# Patient Record
Sex: Male | Born: 1935 | Race: Black or African American | Hispanic: No | Marital: Single | Smoking: Former smoker
Health system: Southern US, Community
[De-identification: ages and names within clinical notes are randomized; demographics above are authoritative.]

## PROBLEM LIST (undated history)

## (undated) DIAGNOSIS — C61 Malignant neoplasm of prostate: Secondary | ICD-10-CM

---

## 2016-06-16 ENCOUNTER — Emergency Department (HOSPITAL_COMMUNITY): Payer: Self-pay

## 2016-06-16 ENCOUNTER — Encounter (HOSPITAL_COMMUNITY): Payer: Self-pay

## 2016-06-16 ENCOUNTER — Observation Stay (HOSPITAL_COMMUNITY)
Admission: EM | Admit: 2016-06-16 | Discharge: 2016-06-17 | Disposition: A | Discharge status: Home / Self Care | Payer: Self-pay | Attending: Internal Medicine | Admitting: Internal Medicine

## 2016-06-16 DIAGNOSIS — Z9981 Dependence on supplemental oxygen: Secondary | ICD-10-CM | POA: Insufficient documentation

## 2016-06-16 DIAGNOSIS — Z79899 Other long term (current) drug therapy: Secondary | ICD-10-CM | POA: Insufficient documentation

## 2016-06-16 DIAGNOSIS — E876 Hypokalemia: Secondary | ICD-10-CM | POA: Insufficient documentation

## 2016-06-16 DIAGNOSIS — I50813 Acute on chronic right heart failure: Secondary | ICD-10-CM | POA: Insufficient documentation

## 2016-06-16 DIAGNOSIS — R9431 Abnormal electrocardiogram [ECG] [EKG]: Secondary | ICD-10-CM | POA: Insufficient documentation

## 2016-06-16 DIAGNOSIS — J9621 Acute and chronic respiratory failure with hypoxia: Principal | ICD-10-CM | POA: Insufficient documentation

## 2016-06-16 DIAGNOSIS — Z87891 Personal history of nicotine dependence: Secondary | ICD-10-CM | POA: Insufficient documentation

## 2016-06-16 DIAGNOSIS — D649 Anemia, unspecified: Secondary | ICD-10-CM | POA: Insufficient documentation

## 2016-06-16 DIAGNOSIS — J441 Chronic obstructive pulmonary disease with (acute) exacerbation: Secondary | ICD-10-CM | POA: Diagnosis present

## 2016-06-16 DIAGNOSIS — I509 Heart failure, unspecified: Secondary | ICD-10-CM

## 2016-06-16 DIAGNOSIS — C61 Malignant neoplasm of prostate: Secondary | ICD-10-CM | POA: Insufficient documentation

## 2016-06-16 DIAGNOSIS — J984 Other disorders of lung: Secondary | ICD-10-CM

## 2016-06-16 DIAGNOSIS — Z7982 Long term (current) use of aspirin: Secondary | ICD-10-CM | POA: Insufficient documentation

## 2016-06-16 DIAGNOSIS — R6510 Systemic inflammatory response syndrome (SIRS) of non-infectious origin without acute organ dysfunction: Secondary | ICD-10-CM | POA: Insufficient documentation

## 2016-06-16 HISTORY — DX: Malignant neoplasm of prostate: C61

## 2016-06-16 LAB — BRAIN NATRIURETIC PEPTIDE
B NATRIURETIC PEPTIDE 5: 239.2 pg/mL — AB (ref 0.0–100.0)
B Natriuretic Peptide: 156.5 pg/mL — ABNORMAL HIGH (ref 0.0–100.0)

## 2016-06-16 LAB — COMPREHENSIVE METABOLIC PANEL
ALT: 7 U/L — AB (ref 17–63)
AST: 16 U/L (ref 15–41)
Albumin: 2.8 g/dL — ABNORMAL LOW (ref 3.5–5.0)
Alkaline Phosphatase: 47 U/L (ref 38–126)
Anion gap: 8 (ref 5–15)
BUN: 14 mg/dL (ref 6–20)
CHLORIDE: 102 mmol/L (ref 101–111)
CO2: 26 mmol/L (ref 22–32)
CREATININE: 0.88 mg/dL (ref 0.61–1.24)
Calcium: 8.5 mg/dL — ABNORMAL LOW (ref 8.9–10.3)
Glucose, Bld: 138 mg/dL — ABNORMAL HIGH (ref 65–99)
POTASSIUM: 3.1 mmol/L — AB (ref 3.5–5.1)
SODIUM: 136 mmol/L (ref 135–145)
Total Bilirubin: 0.5 mg/dL (ref 0.3–1.2)
Total Protein: 6.8 g/dL (ref 6.5–8.1)

## 2016-06-16 LAB — CBC WITH DIFFERENTIAL/PLATELET
BASOS ABS: 0 10*3/uL (ref 0.0–0.1)
BASOS PCT: 0 %
EOS ABS: 0.3 10*3/uL (ref 0.0–0.7)
EOS PCT: 4 %
HCT: 31.7 % — ABNORMAL LOW (ref 39.0–52.0)
Hemoglobin: 9.7 g/dL — ABNORMAL LOW (ref 13.0–17.0)
Lymphocytes Relative: 30 %
Lymphs Abs: 2.2 10*3/uL (ref 0.7–4.0)
MCH: 28.5 pg (ref 26.0–34.0)
MCHC: 30.6 g/dL (ref 30.0–36.0)
MCV: 93.2 fL (ref 78.0–100.0)
Monocytes Absolute: 0.7 10*3/uL (ref 0.1–1.0)
Monocytes Relative: 9 %
NEUTROS PCT: 57 %
Neutro Abs: 4.1 10*3/uL (ref 1.7–7.7)
PLATELETS: 233 10*3/uL (ref 150–400)
RBC: 3.4 MIL/uL — AB (ref 4.22–5.81)
RDW: 14.4 % (ref 11.5–15.5)
WBC: 7.3 10*3/uL (ref 4.0–10.5)

## 2016-06-16 LAB — TROPONIN I: TROPONIN I: 0.03 ng/mL — AB (ref <0.03)

## 2016-06-16 LAB — I-STAT VENOUS BLOOD GAS, ED
Acid-Base Excess: 6 mmol/L — ABNORMAL HIGH (ref 0.0–2.0)
Bicarbonate: 31.9 mmol/L — ABNORMAL HIGH (ref 20.0–28.0)
O2 Saturation: 81 %
PH VEN: 7.413 (ref 7.250–7.430)
TCO2: 33 mmol/L (ref 0–100)
pCO2, Ven: 50.1 mmHg (ref 44.0–60.0)
pO2, Ven: 46 mmHg — ABNORMAL HIGH (ref 32.0–45.0)

## 2016-06-16 LAB — I-STAT CG4 LACTIC ACID, ED: LACTIC ACID, VENOUS: 1.27 mmol/L (ref 0.5–1.9)

## 2016-06-16 LAB — PROTIME-INR
INR: 1.15
Prothrombin Time: 14.7 seconds (ref 11.4–15.2)

## 2016-06-16 LAB — APTT: aPTT: 39 seconds — ABNORMAL HIGH (ref 24–36)

## 2016-06-16 LAB — I-STAT TROPONIN, ED: TROPONIN I, POC: 0.01 ng/mL (ref 0.00–0.08)

## 2016-06-16 LAB — MAGNESIUM: MAGNESIUM: 1.6 mg/dL — AB (ref 1.7–2.4)

## 2016-06-16 LAB — LACTIC ACID, PLASMA: Lactic Acid, Venous: 3 mmol/L (ref 0.5–1.9)

## 2016-06-16 MED ORDER — METHYLPREDNISOLONE SODIUM SUCC 125 MG IJ SOLR
125.0000 mg | Freq: Once | INTRAMUSCULAR | Status: AC
  Administered 2016-06-16: 125 mg via INTRAVENOUS
  Filled 2016-06-16: qty 2

## 2016-06-16 MED ORDER — IPRATROPIUM-ALBUTEROL 0.5-2.5 (3) MG/3ML IN SOLN: 3.0000 mL | RESPIRATORY_TRACT | Status: DC | PRN

## 2016-06-16 MED ORDER — ACETAMINOPHEN 325 MG PO TABS: 650.0000 mg | ORAL_TABLET | Freq: Four times a day (QID) | ORAL | Status: DC | PRN

## 2016-06-16 MED ORDER — IPRATROPIUM-ALBUTEROL 0.5-2.5 (3) MG/3ML IN SOLN: 3.0000 mL | Freq: Once | RESPIRATORY_TRACT | Status: DC

## 2016-06-16 MED ORDER — OXYCODONE-ACETAMINOPHEN 5-325 MG PO TABS: 1.0000 | ORAL_TABLET | Freq: Three times a day (TID) | ORAL | Status: DC | PRN

## 2016-06-16 MED ORDER — SODIUM CHLORIDE 0.9% FLUSH
3.0000 mL | Freq: Two times a day (BID) | INTRAVENOUS | Status: DC
  Administered 2016-06-16: 3 mL via INTRAVENOUS

## 2016-06-16 MED ORDER — METHYLPREDNISOLONE SODIUM SUCC 125 MG IJ SOLR
60.0000 mg | Freq: Two times a day (BID) | INTRAMUSCULAR | Status: DC
  Administered 2016-06-17: 60 mg via INTRAVENOUS
  Filled 2016-06-16: qty 2

## 2016-06-16 MED ORDER — HYDROCOD POLST-CPM POLST ER 10-8 MG/5ML PO SUER
5.0000 mL | Freq: Two times a day (BID) | ORAL | Status: DC
  Administered 2016-06-16 – 2016-06-17 (×2): 5 mL via ORAL
  Filled 2016-06-16 (×2): qty 5

## 2016-06-16 MED ORDER — BENZONATATE 100 MG PO CAPS
200.0000 mg | ORAL_CAPSULE | Freq: Every day | ORAL | Status: DC
  Administered 2016-06-17: 200 mg via ORAL
  Filled 2016-06-16: qty 2

## 2016-06-16 MED ORDER — ASPIRIN EC 81 MG PO TBEC
81.0000 mg | DELAYED_RELEASE_TABLET | Freq: Every day | ORAL | Status: DC
  Administered 2016-06-17: 81 mg via ORAL
  Filled 2016-06-16: qty 1

## 2016-06-16 MED ORDER — VITAMIN D 1000 UNITS PO TABS
2000.0000 [IU] | ORAL_TABLET | Freq: Every day | ORAL | Status: DC
  Administered 2016-06-17: 2000 [IU] via ORAL
  Filled 2016-06-16: qty 2

## 2016-06-16 MED ORDER — SODIUM CHLORIDE 0.9% FLUSH: 3.0000 mL | INTRAVENOUS | Status: DC | PRN

## 2016-06-16 MED ORDER — OXYCODONE HCL 5 MG PO TABS: 5.0000 mg | ORAL_TABLET | Freq: Three times a day (TID) | ORAL | Status: DC | PRN

## 2016-06-16 MED ORDER — FUROSEMIDE 10 MG/ML IJ SOLN
20.0000 mg | Freq: Once | INTRAMUSCULAR | Status: AC
  Administered 2016-06-16: 20 mg via INTRAVENOUS
  Filled 2016-06-16: qty 2

## 2016-06-16 MED ORDER — IOPAMIDOL (ISOVUE-370) INJECTION 76%
INTRAVENOUS | Status: AC
  Administered 2016-06-16: 80 mL
  Filled 2016-06-16: qty 100

## 2016-06-16 MED ORDER — SODIUM CHLORIDE 0.9% FLUSH
3.0000 mL | Freq: Two times a day (BID) | INTRAVENOUS | Status: DC
  Administered 2016-06-17: 3 mL via INTRAVENOUS

## 2016-06-16 MED ORDER — SODIUM CHLORIDE 0.9 % IV SOLN: 250.0000 mL | INTRAVENOUS | Status: DC | PRN

## 2016-06-16 MED ORDER — OXYCODONE-ACETAMINOPHEN 10-325 MG PO TABS: 1.0000 | ORAL_TABLET | Freq: Three times a day (TID) | ORAL | Status: DC | PRN

## 2016-06-16 MED ORDER — ENOXAPARIN SODIUM 40 MG/0.4ML ~~LOC~~ SOLN
40.0000 mg | SUBCUTANEOUS | Status: DC
  Administered 2016-06-16: 40 mg via SUBCUTANEOUS
  Filled 2016-06-16: qty 0.4

## 2016-06-16 MED ORDER — POTASSIUM CHLORIDE CRYS ER 20 MEQ PO TBCR
40.0000 meq | EXTENDED_RELEASE_TABLET | Freq: Once | ORAL | Status: AC
  Administered 2016-06-16: 40 meq via ORAL
  Filled 2016-06-16: qty 2

## 2016-06-16 MED ORDER — ASPIRIN 81 MG PO CHEW
324.0000 mg | CHEWABLE_TABLET | Freq: Once | ORAL | Status: AC
  Administered 2016-06-16: 324 mg via ORAL
  Filled 2016-06-16: qty 4

## 2016-06-16 MED ORDER — LEVOFLOXACIN IN D5W 750 MG/150ML IV SOLN
750.0000 mg | INTRAVENOUS | Status: DC
  Administered 2016-06-16: 750 mg via INTRAVENOUS
  Filled 2016-06-16: qty 150

## 2016-06-16 MED ORDER — BICALUTAMIDE 50 MG PO TABS
50.0000 mg | ORAL_TABLET | Freq: Every day | ORAL | Status: DC
  Administered 2016-06-17: 50 mg via ORAL
  Filled 2016-06-16: qty 1

## 2016-06-16 MED ORDER — ACETAMINOPHEN 650 MG RE SUPP
650.0000 mg | Freq: Four times a day (QID) | RECTAL | Status: DC | PRN
Start: 2016-06-16 — End: 2016-06-17

## 2016-06-16 NOTE — ED Triage Notes (Signed)
Pt arrives [facility] with c/o SHOB. From out of town and became shob after walking up stairs. Pt states scarring on lungs from unknown reason. Arrives as neb treatment completes.

## 2016-06-16 NOTE — ED Notes (Signed)
Patient transported to CT 

## 2016-06-16 NOTE — H&P (Signed)
History and Physical    [facility] KH:[id number] DOB: 07/26/1936 DOA: 06/16/2016  PCP: No primary care provider on file.   Patient coming from: Home   Chief Complaint: SOB   HPI: Roy Lucas is a 80 y.o. male with medical history significant for prostate cancer and chronic lung disease managed with bronchodilators and prednisone who presents to the emergency department with acute dyspnea and fatigue. Patient is from [place], [place], having just arrived here by train on 06/14/2016 visit family and had been in his usual state of health upon arrival. Patient reports going to bed in his usual state last night, but waking this morning with a mild fatigue and general malaise. He went about his usual activities today, but upon ascending a flight of stairs, developed severe dyspnea and EMS was activated. Patient denies any lower extremity edema or tenderness, orthopnea, or PND. He endorses some malaise, but denies fevers or chills. He has an increase in his chronic cough, but it is not particularly productive. Patient uses 6 L per minute of supplemental oxygen at his baseline and was using that when symptoms develop. There was no associated chest pain, palpitations, nausea, or diaphoresis. There's been no hemoptysis. Patient denies sick contacts. He was placed on nonrebreather en route to the hospital.  ED Course: Upon arrival to the ED, patient is found to be febrile to 38.2 C, saturating 60% on room air, tachypneic in the 30s, tachycardic in the 120s, and with stable blood pressure. EKG demonstrates a sinus tachycardia with rate 125 and ST-depression in the inferolateral leads. Troponin is reassuring at 0.01. Chemistry panel is notable for a potassium of 3.1 and serum albumin of 2.8. CBC is notable for a normocytic anemia with hemoglobin of 9.7. Lactic acid is reassuring at 1.27. CTA PE study was obtained, negative for PE, and notable for chronic underlying lung disease with possible mild  edema or atypical infection superimposed. Also noted on CT are findings consistent with right heart failure. Patient was treated with 324 mg of aspirin, 20 mg IV Lasix, 125 mg IV Solu-Medrol, nebs, and 40 mEq oral potassium. Patient enjoyed some subjective improvement in the ED. Tachycardia improved considerably, as did his work of breathing and oxygenation. Patient remained hemodynamically stable in the ED and free of any angina. He will be observed on the telemetry unit for ongoing evaluation and management of acute on chronic hypoxic respiratory failure suspected secondary to infectious exacerbation in his chronic underlying lung disease.  Review of Systems:  All other systems reviewed and apart from HPI, are negative.  Past Medical History:  Diagnosis Date  . Cancer [facility])     History reviewed. No pertinent surgical history.   reports that he quit smoking about 30 years ago. He has never used smokeless tobacco. He reports that he does not drink alcohol or use drugs.  No Known Allergies  Family History  Problem Relation Age of Onset  . Heart attack Neg Hx      Prior to Admission medications   Medication Sig Start Date End Date Taking? Authorizing Provider  albuterol (PROVENTIL HFA;VENTOLIN HFA) 108 (90 Base) MCG/ACT inhaler Inhale 1 puff into the lungs every 6 (six) hours as needed for wheezing or shortness of breath.   Yes Historical Provider, MD  aspirin EC 81 MG tablet Take 81 mg by mouth daily.   Yes Historical Provider, MD  benzonatate (TESSALON) 100 MG capsule Take 200 mg by mouth daily.   Yes Historical Provider, MD  bicalutamide (CASODEX) 50  MG tablet Take 50 mg by mouth daily. 06/07/16  Yes Historical Provider, MD  chlorpheniramine-HYDROcodone (TUSSIONEX PENNKINETIC ER) 10-8 MG/5ML SUER Take 5 mLs by mouth 2 (two) times daily.   Yes Historical Provider, MD  Cholecalciferol (VITAMIN D) 2000 units tablet Take 2,000 Units by mouth daily.   Yes Historical Provider, MD    oxyCODONE-acetaminophen (PERCOCET) 10-325 MG tablet Take 1 tablet by mouth every 8 (eight) hours.   Yes Historical Provider, MD  OXYGEN Inhale 6 L into the lungs continuous.   Yes Historical Provider, MD  predniSONE (DELTASONE) 5 MG tablet Take 5 mg by mouth daily. 06/09/16  Yes Historical Provider, MD  PRESCRIPTION MEDICATION Cancer treatment injection every 3 months at [facility] in [place], [place] - next injection due approx 07/21/16   Yes Historical Provider, MD    Physical Exam: Vitals:   06/16/16 1745 06/16/16 1800 06/16/16 1815 06/16/16 1830  BP:  135/88 130/82 130/91  Pulse: 102 102 100 101  Resp: 11 (!) 32 (!) 30 (!) 33  Temp:      SpO2: 100% 99% 99% 98%  Weight:      Height:          Constitutional: Increased WOB, calm, comfortable Eyes: PERTLA, lids and conjunctivae normal ENMT: Mucous membranes are moist. Posterior pharynx clear of any exudate or lesions.   Neck: normal, supple, no masses, no thyromegaly Respiratory: Mildly diminished bilaterally with occasional expiratory wheeze. Speaking full sentences, no pallor.  Cardiovascular: S1 & S2 heard, regular rate and rhythm. Trace b/l pretibial edema. No significant JVD. Abdomen: No distension, no tenderness, no masses palpated. Bowel sounds normal.  Musculoskeletal: no clubbing / cyanosis. No joint deformity upper and lower extremities. Normal muscle tone.  Skin: no significant rashes, lesions, ulcers. Warm, dry, well-perfused. Neurologic: CN 2-12 grossly intact. Sensation intact, DTR normal. Strength 5/5 in all 4 limbs.  Psychiatric: Normal judgment and insight. Alert and oriented x 3. Normal mood and affect.     Labs on Admission: I have personally reviewed following labs and imaging studies  CBC:  Recent Labs Lab 06/16/16 1550  WBC 7.3  NEUTROABS 4.1  HGB 9.7*  HCT 31.7*  MCV 93.2  PLT 0000000   Basic Metabolic Panel:  Recent Labs Lab 06/16/16 1550  NA 136  K 3.1*  CL 102  CO2 26  GLUCOSE 138*  BUN 14   CREATININE 0.88  CALCIUM 8.5*   GFR: Estimated Creatinine Clearance: 62.4 mL/min (by C-G formula based on SCr of 0.88 mg/dL). Liver Function Tests:  Recent Labs Lab 06/16/16 1550  AST 16  ALT 7*  ALKPHOS 47  BILITOT 0.5  PROT 6.8  ALBUMIN 2.8*   No results for input(s): LIPASE, AMYLASE in the last 168 hours. No results for input(s): AMMONIA in the last 168 hours. Coagulation Profile: No results for input(s): INR, PROTIME in the last 168 hours. Cardiac Enzymes: No results for input(s): CKTOTAL, CKMB, CKMBINDEX, TROPONINI in the last 168 hours. BNP (last 3 results) No results for input(s): PROBNP in the last 8760 hours. HbA1C: No results for input(s): HGBA1C in the last 72 hours. CBG: No results for input(s): GLUCAP in the last 168 hours. Lipid Profile: No results for input(s): CHOL, HDL, LDLCALC, TRIG, CHOLHDL, LDLDIRECT in the last 72 hours. Thyroid Function Tests: No results for input(s): TSH, T4TOTAL, FREET4, T3FREE, THYROIDAB in the last 72 hours. Anemia Panel: No results for input(s): VITAMINB12, FOLATE, FERRITIN, TIBC, IRON, RETICCTPCT in the last 72 hours. Urine analysis: No results found for:  COLORURINE, APPEARANCEUR, LABSPEC, PHURINE, GLUCOSEU, HGBUR, BILIRUBINUR, KETONESUR, PROTEINUR, UROBILINOGEN, NITRITE, LEUKOCYTESUR Sepsis Labs: @LABRCNTIP (procalcitonin:4,lacticidven:4) )No results found for this or any previous visit (from the past 240 hour(s)).   Radiological Exams on Admission: Ct Angio Chest Pe W And/or Wo Contrast  Result Date: 06/16/2016 CLINICAL DATA:  Progressive shortness breath for 1 year. Former smoker. Tachycardia without chest pain. EXAM: CT ANGIOGRAPHY CHEST WITH CONTRAST TECHNIQUE: Multidetector CT imaging of the chest was performed using the standard protocol during bolus administration of intravenous contrast. Multiplanar CT image reconstructions and MIPs were obtained to evaluate the vascular anatomy. CONTRAST:  80 ml Isovue 370 COMPARISON:   None. FINDINGS: Cardiovascular: The pulmonary arteries are well opacified to the level of the subsegmental branches. No evidence of acute pulmonary embolism. There is central enlargement of the pulmonary arteries suspicious for pulmonary arterial hypertension. There is suboptimal opacification of the aortic lumen. There is diffuse atherosclerosis of the aorta, great vessels and coronary arteries. No acute vascular findings are seen. The heart is enlarged. There is no pericardial effusion. Mediastinum/Nodes: There are no enlarged mediastinal, hilar or axillary lymph nodes.There are small mediastinal and hilar lymph nodes. The thyroid gland, trachea and esophagus demonstrate no significant findings. Lungs/Pleura: There is no pleural effusion. There is evidence of severe underlying chronic lung disease with patchy multifocal ground-glass attenuation, subpleural and peribronchovascular reticulation and architectural distortion. Probable early honeycomb formation at both lung bases. Upper abdomen: There is some reflux of contrast into the IVC and hepatic veins. The visualized upper abdomen otherwise appears unremarkable. Musculoskeletal/Chest wall: There is no chest wall mass or suspicious osseous finding. Mild degenerative changes throughout the spine status post lower cervical fusion. Review of the MIP images confirms the above findings. IMPRESSION: 1. No evidence of acute pulmonary embolism. Central enlargement of the pulmonary arteries suspicious for pulmonary arterial hypertension. 2. Evidence of underlying chronic lung disease, possibly desquamative interstitial pneumonia. Without prior examinations, mild superimposed edema or atypical infection difficult to exclude. Chest radiographic follow up recommended. 3. Cardiomegaly with reflux of contrast into the IVC and hepatic veins consistent with right heart failure. 4. No evidence of thoracic malignancy. Electronically Signed   By: Richardean Sale M.D.   On:  06/16/2016 17:34    EKG: Independently reviewed. Sinus tachycardia (rate 125), ST-depressions in inferolateral leads.   Assessment/Plan  1. Acute on chronic hypoxic respiratory failure  - Pt requires 6 Lpm of supplemental O2 at baseline for a chronic lung disease for which he is seeing a pulmonologist in [place]; per discussion with patient and his son, saw pulm in September and there is aworkup is in progress   - With wheezing and diminished breath sounds on admission, suspect an exacerbation in obstructive lung disease; likely an infectious precipitant given fever on presentation; no infiltrate on CTA chest    2. Acute exacerbation in chronic lung disease  - Managed at home with supplemental O2, bronchodialator, and oral prednisone  - Improved in ED with nebs, 125 mg IV Solu-Medrol  - Continue DuoNeb q4h prn, Solu-Medrol 60 mg IV q12h  - Check sputum culture and gram-stain  - Levaquin added given fever and suspected infectious precipitant  - Monitor with continuous pulse oximetry and titrate FiO2 to maintain sat low-mid 90's    3. ?Acute on chronic right-heart failure - Evidence for right-heart failure on CTA chest, likely secondary to the chronic lung disease  - There is no significant peripheral edema on admission  - Given Lasix 20 mg IV in ED  -  Follow daily wts and I/O's    4. Normocytic anemia   - Hgb 9.7 on admission with no priors available for comparison  - No s/s of active blood-loss; no pallor    5. Hypokalemia - Serum potassium 3.1 on admission - 40 mEq oral potassium given in ED - Magnesium level pending, replete prn  - Repeat chem panel in am    6. ST-depressions on EKG  - No angina on arrival and pt denies hx of heart disease  - Initial troponin reassuring at 0.01  - ASA 324 mg given in ED  - ST-depressions could be secondary to hypokalemia  - Plan to replace potassium, monitor on telemetry, repeat troponin, and repeat EKG in am    DVT prophylaxis: sq  Lovenox  Code Status: Full  Family Communication: Son updated at bedside  Disposition Plan: Observe on telemetry Consults called: None Admission status: Observation    Vianne Bulls, MD [facility] Pager [phone]  If 7PM-7AM, please contact night-coverage [email or web address] Password [facility]  06/16/2016, 7:47 PM

## 2016-06-16 NOTE — ED Provider Notes (Addendum)
[name] DEPT Provider Note   CSN: [id number] Arrival date & time: 06/16/16  1505     History   Chief Complaint Chief Complaint  Patient presents with  . Shortness of Breath    HPI Roy Lucas is a 80 y.o. male. Patient is visiting family from [place]. He had a 12 hour train ride day before yesterday. After arriving he was very exhausted. His son reports that he spent most the day yesterday resting and sleeping. This is atypical for the patient. The patient reports that he did feel extra fatigued but did not have any focal pain. He denies chest pain. He denies lower extremity swelling. Patient does have a history of COPD and is on 6 L of nasal cannula oxygen at baseline. Family members also describe "scarring at the bases of his lungs". Patient has very distant smoking history and no longer smokes. No alcohol use. The patient did forget several of his medications while traveling. He has gone 2 days without his albuterol inhaler and also at baseline he reportedly takes a dose of prednisone which he has not had in 2 days. Patient son noted that he seemed more lethargic today and did not do the things he usually does such as get up and get dressed and read. The patient reports that today he got unusually short of breath fairly quickly. The patient's son reports that really got him anxious was that his father was shaking in his hands. The patient was brought via EMS with [facility]. He had significantly improved on arrival. He was off of his baseline oxygen and dropped oxygen saturation to 60% on room air. This corrected quickly with nonrebreather mask. At the time of my interview the patient is alert and interactive. He is appropriate. He is denying acute complaints of shortness of breath that is different from baseline at this time. Patient reports at baseline he become short of breath or uncomfortable if he lies on his left side or on his back. He states he typically sleeps on his  right side.  Patient's past medical history significant for prostate cancer that is being treated medically. Patient family deny any history of cardiac disease or congestive heart failure.  HPI  Past Medical History:  Diagnosis Date  . Cancer [facility])     Patient Active Problem List   Diagnosis Date Noted  . Acute on chronic respiratory failure with hypoxia ([place]) 06/16/2016  . Acute on chronic right heart failure 06/16/2016  . Normocytic anemia 06/16/2016  . Hypokalemia 06/16/2016  . Chronic lung disease 06/16/2016  . Former smoker 06/16/2016  . SIRS (systemic inflammatory response syndrome) ([place]) 06/16/2016  . CHF (congestive heart failure) ([place]) 06/16/2016    History reviewed. No pertinent surgical history.     Home Medications    Prior to Admission medications   Medication Sig Start Date End Date Taking? Authorizing Provider  albuterol (PROVENTIL HFA;VENTOLIN HFA) 108 (90 Base) MCG/ACT inhaler Inhale 1 puff into the lungs every 6 (six) hours as needed for wheezing or shortness of breath.   Yes Historical Provider, MD  aspirin EC 81 MG tablet Take 81 mg by mouth daily.   Yes Historical Provider, MD  benzonatate (TESSALON) 100 MG capsule Take 200 mg by mouth daily.   Yes Historical Provider, MD  bicalutamide (CASODEX) 50 MG tablet Take 50 mg by mouth daily. 06/07/16  Yes Historical Provider, MD  chlorpheniramine-HYDROcodone (TUSSIONEX PENNKINETIC ER) 10-8 MG/5ML SUER Take 5 mLs by mouth 2 (two) times daily.  Yes Historical Provider, MD  Cholecalciferol (VITAMIN D) 2000 units tablet Take 2,000 Units by mouth daily.   Yes Historical Provider, MD  oxyCODONE-acetaminophen (PERCOCET) 10-325 MG tablet Take 1 tablet by mouth every 8 (eight) hours.   Yes Historical Provider, MD  OXYGEN Inhale 6 L into the lungs continuous.   Yes Historical Provider, MD  predniSONE (DELTASONE) 5 MG tablet Take 5 mg by mouth daily. 06/09/16  Yes Historical Provider, MD  PRESCRIPTION MEDICATION Cancer  treatment injection every 3 months at [facility] in [place], [place] - next injection due approx 07/21/16   Yes Historical Provider, MD    Family History History reviewed. No pertinent family history.  Social History Social History  Substance Use Topics  . Smoking status: Former Smoker    Quit date: 03/16/1986  . Smokeless tobacco: Never Used  . Alcohol use No     Allergies   Review of patient's allergies indicates no known allergies.   Review of Systems Review of Systems 10 Systems reviewed and are negative for acute change except as noted in the HPI.   Physical Exam Updated Vital Signs BP 130/91   Pulse 101   Temp 100.7 F (38.2 C)   Resp (!) 33   Ht 5\' 6"  (1.676 m)   Wt 154 lb (69.9 kg)   SpO2 98%   BMI 24.86 kg/m   Physical Exam  Constitutional: He is oriented to person, place, and time. He appears well-developed and well-nourished.  HENT:  Head: Normocephalic and atraumatic.  Mouth/Throat: Oropharynx is clear and moist.  Eyes: Conjunctivae and EOM are normal.  Neck: Neck supple.  Cardiovascular:  No murmur heard. Tachycardia. Regular. No gross rub murmur gallop.  Pulmonary/Chest:  Mild increased work of breathing at rest. Patient speaking in full sentences. Pulmonary examination is for bilateral Rales at lower one thirds of the lung fields. No active wheezing.  Abdominal: Soft. He exhibits no distension. There is no tenderness.  Musculoskeletal: He exhibits no edema, tenderness or deformity.  Neurological: He is alert and oriented to person, place, and time. No cranial nerve deficit. He exhibits normal muscle tone. Coordination normal.  Skin: Skin is warm and dry.  Psychiatric: He has a normal mood and affect.  Nursing note and vitals reviewed.    ED Treatments / Results  Labs (all labs ordered are listed, but only abnormal results are displayed) Labs Reviewed  COMPREHENSIVE METABOLIC PANEL - Abnormal; Notable for the following:       Result Value    Potassium 3.1 (*)    Glucose, Bld 138 (*)    Calcium 8.5 (*)    Albumin 2.8 (*)    ALT 7 (*)    All other components within normal limits  CBC WITH DIFFERENTIAL/PLATELET - Abnormal; Notable for the following:    RBC 3.40 (*)    Hemoglobin 9.7 (*)    HCT 31.7 (*)    All other components within normal limits  I-STAT VENOUS BLOOD GAS, ED - Abnormal; Notable for the following:    pO2, Ven 46.0 (*)    Bicarbonate 31.9 (*)    Acid-Base Excess 6.0 (*)    All other components within normal limits  BRAIN NATRIURETIC PEPTIDE  URINALYSIS, ROUTINE W REFLEX MICROSCOPIC (NOT AT [facility])  Randolm Idol, ED  I-STAT CG4 LACTIC ACID, ED  I-STAT CG4 LACTIC ACID, ED    EKG  EKG Interpretation  Date/Time:  Friday June 16 2016 15:14:54 EDT Ventricular Rate:  125 PR Interval:  132 QRS Duration:  84 QT Interval:  280 QTC Calculation: 404 R Axis:   1 Text Interpretation:  Sinus tachycardia Cannot rule out Inferior infarct , age undetermined ST & T wave abnormality, consider lateral ischemia Abnormal ECG agree. no old comparison Confirmed by Johnney Killian, MD, Jeannie Done [phone]) on 06/16/2016 6:11:56 PM       Radiology Ct Angio Chest Pe W And/or Wo Contrast  Result Date: 06/16/2016 CLINICAL DATA:  Progressive shortness breath for 1 year. Former smoker. Tachycardia without chest pain. EXAM: CT ANGIOGRAPHY CHEST WITH CONTRAST TECHNIQUE: Multidetector CT imaging of the chest was performed using the standard protocol during bolus administration of intravenous contrast. Multiplanar CT image reconstructions and MIPs were obtained to evaluate the vascular anatomy. CONTRAST:  80 ml Isovue 370 COMPARISON:  None. FINDINGS: Cardiovascular: The pulmonary arteries are well opacified to the level of the subsegmental branches. No evidence of acute pulmonary embolism. There is central enlargement of the pulmonary arteries suspicious for pulmonary arterial hypertension. There is suboptimal opacification of the aortic lumen.  There is diffuse atherosclerosis of the aorta, great vessels and coronary arteries. No acute vascular findings are seen. The heart is enlarged. There is no pericardial effusion. Mediastinum/Nodes: There are no enlarged mediastinal, hilar or axillary lymph nodes.There are small mediastinal and hilar lymph nodes. The thyroid gland, trachea and esophagus demonstrate no significant findings. Lungs/Pleura: There is no pleural effusion. There is evidence of severe underlying chronic lung disease with patchy multifocal ground-glass attenuation, subpleural and peribronchovascular reticulation and architectural distortion. Probable early honeycomb formation at both lung bases. Upper abdomen: There is some reflux of contrast into the IVC and hepatic veins. The visualized upper abdomen otherwise appears unremarkable. Musculoskeletal/Chest wall: There is no chest wall mass or suspicious osseous finding. Mild degenerative changes throughout the spine status post lower cervical fusion. Review of the MIP images confirms the above findings. IMPRESSION: 1. No evidence of acute pulmonary embolism. Central enlargement of the pulmonary arteries suspicious for pulmonary arterial hypertension. 2. Evidence of underlying chronic lung disease, possibly desquamative interstitial pneumonia. Without prior examinations, mild superimposed edema or atypical infection difficult to exclude. Chest radiographic follow up recommended. 3. Cardiomegaly with reflux of contrast into the IVC and hepatic veins consistent with right heart failure. 4. No evidence of thoracic malignancy. Electronically Signed   By: Richardean Sale M.D.   On: 06/16/2016 17:34    Procedures Procedures (including critical care time) CRITICAL CARE Performed by: Charlesetta Shanks   Total critical care time: 30 minutes  Critical care time was exclusive of separately billable procedures and treating other patients.  Critical care was necessary to treat or prevent imminent  or life-threatening deterioration.  Critical care was time spent personally by me on the following activities: development of treatment plan with patient and/or surrogate as well as nursing, discussions with consultants, evaluation of patient's response to treatment, examination of patient, obtaining history from patient or surrogate, ordering and performing treatments and interventions, ordering and review of laboratory studies, ordering and review of radiographic studies, pulse oximetry and re-evaluation of patient's condition. Medications Ordered in ED Medications  potassium chloride SA (K-DUR,KLOR-CON) CR tablet 40 mEq (not administered)  ipratropium-albuterol (DUONEB) 0.5-2.5 (3) MG/3ML nebulizer solution 3 mL (not administered)  furosemide (LASIX) injection 20 mg (not administered)  aspirin chewable tablet 324 mg (not administered)  methylPREDNISolone sodium succinate (SOLU-MEDROL) 125 mg/2 mL injection 125 mg (125 mg Intravenous Given 06/16/16 1653)  iopamidol (ISOVUE-370) 76 % injection (80 mLs  Contrast Given 06/16/16 1712)     Initial Impression /  Assessment and Plan / ED Course  I have reviewed the triage vital signs and the nursing notes.  Pertinent labs & imaging results that were available during my care of the patient were reviewed by me and considered in my medical decision making (see chart for details).  Clinical Course  Consult: Dr. Tressa Busman hospitalist admission  Final Clinical Impressions(s) / ED Diagnoses   Final diagnoses:  Acute congestive heart failure, unspecified congestive heart failure type ([place])  COPD exacerbation ([place])   Patient presents with acute onset of worsening dyspnea. Patient does have known baseline COPD and possibly pulmonary fibrosis or other parenchymal scarring condition by history. At baseline he is on 6 L home O2. Today however he got much more dyspneic quickly then he has been in the past. Patient did not experience chest pain but also became  tremulous and has exhibited significant fatigue and change in energy level. CT has ruled out pulmonary embolus but suggests possible right heart failure in addition to other baseline pulmonary disease. At this time the patient will be placed in observation for respiratory status declining from baseline and monitoring. New Prescriptions New Prescriptions   No medications on file     Charlesetta Shanks, MD 06/16/16 [id number]    Charlesetta Shanks, MD 06/16/16 1925

## 2016-06-16 NOTE — ED Notes (Signed)
No addl lab draw,  Pt now enroute to inpatient floor.

## 2016-06-17 ENCOUNTER — Other Ambulatory Visit: Payer: Self-pay

## 2016-06-17 DIAGNOSIS — J984 Other disorders of lung: Secondary | ICD-10-CM

## 2016-06-17 LAB — URINALYSIS, ROUTINE W REFLEX MICROSCOPIC
Bilirubin Urine: NEGATIVE
GLUCOSE, UA: NEGATIVE mg/dL
Hgb urine dipstick: NEGATIVE
KETONES UR: 15 mg/dL — AB
LEUKOCYTES UA: NEGATIVE
NITRITE: NEGATIVE
PROTEIN: NEGATIVE mg/dL
Specific Gravity, Urine: 1.02 (ref 1.005–1.030)
pH: 6 (ref 5.0–8.0)

## 2016-06-17 LAB — BASIC METABOLIC PANEL
Anion gap: 7 (ref 5–15)
BUN: 15 mg/dL (ref 6–20)
CALCIUM: 8.8 mg/dL — AB (ref 8.9–10.3)
CO2: 28 mmol/L (ref 22–32)
CREATININE: 0.93 mg/dL (ref 0.61–1.24)
Chloride: 101 mmol/L (ref 101–111)
GFR calc non Af Amer: >60 mL/min (ref >60)
GLUCOSE: 130 mg/dL — AB (ref 65–99)
Potassium: 4.8 mmol/L (ref 3.5–5.1)
Sodium: 136 mmol/L (ref 135–145)

## 2016-06-17 LAB — GLUCOSE, CAPILLARY
GLUCOSE-CAPILLARY: 125 mg/dL — AB (ref 65–99)
Glucose-Capillary: 215 mg/dL — ABNORMAL HIGH (ref 65–99)

## 2016-06-17 LAB — TROPONIN I
TROPONIN I: 0.03 ng/mL — AB (ref <0.03)
Troponin I: <0.03 ng/mL (ref <0.03)

## 2016-06-17 LAB — PROCALCITONIN

## 2016-06-17 LAB — LACTIC ACID, PLASMA: Lactic Acid, Venous: 1 mmol/L (ref 0.5–1.9)

## 2016-06-17 MED ORDER — SODIUM CHLORIDE 0.9 % IV BOLUS (SEPSIS)
500.0000 mL | Freq: Once | INTRAVENOUS | Status: AC
  Administered 2016-06-17: 500 mL via INTRAVENOUS

## 2016-06-17 MED ORDER — PREDNISONE 10 MG PO TABS: ORAL_TABLET | ORAL | 0 refills | Status: AC

## 2016-06-17 MED ORDER — MAGNESIUM SULFATE 2 GM/50ML IV SOLN
2.0000 g | Freq: Once | INTRAVENOUS | Status: AC
  Administered 2016-06-17: 2 g via INTRAVENOUS
  Filled 2016-06-17: qty 50

## 2016-06-17 NOTE — Progress Notes (Signed)
Patient waiting on family to bring Oxygen tank for Discharge.

## 2016-06-17 NOTE — Progress Notes (Signed)
Patient tolerated  Walking with  Oxygen Will be discharged

## 2016-06-17 NOTE — Discharge Summary (Signed)
Physician Discharge Summary  [facility] [id number] DOB: 10/17/1935 DOA: 06/16/2016  PCP: No primary care provider on file.  Admit date: 06/16/2016 Discharge date: 06/17/2016  Time spent: 45 minutes  Recommendations for Outpatient Follow-up:  Patient will be discharged to home with home oxygen.  Patient will need to follow up with primary care provider within one week of discharge.  Follow up pulmonology and cardiology. Patient should continue medications as prescribed.  Patient should follow a heart healthy diet.   Discharge Diagnoses:  Principal Problem:   Acute infective exacerbation of chronic obstructive airway disease (HCC) Active Problems:   Acute on chronic respiratory failure with hypoxia (HCC)   Acute on chronic right heart failure   Normocytic anemia   Hypokalemia   Chronic lung disease   SIRS (systemic inflammatory response syndrome) (HCC)   CHF (congestive heart failure) (HCC)   Acute congestive heart failure [facility])  Discharge Condition: Stable  Diet recommendation: heart healthy  Filed Weights   06/16/16 1605 06/16/16 2030 06/17/16 0431  Weight: 69.9 kg (154 lb) 71.3 kg (157 lb 1.6 oz) 70.7 kg (155 lb 13.8 oz)    History of present illness:  On 06/16/2016 by Dr. Christia Reading Opyd Roy Lucas is a 80 y.o. male with medical history significant for prostate cancer and chronic lung disease managed with bronchodilators and prednisone who presents to the emergency department with acute dyspnea and fatigue. Patient is from [place], [place], having just arrived here by train on 06/14/2016 visit family and had been in his usual state of health upon arrival. Patient reports going to bed in his usual state last night, but waking this morning with a mild fatigue and general malaise. He went about his usual activities today, but upon ascending a flight of stairs, developed severe dyspnea and EMS was activated. Patient denies any lower extremity edema or tenderness,  orthopnea, or PND. He endorses some malaise, but denies fevers or chills. He has an increase in his chronic cough, but it is not particularly productive. Patient uses 6 L per minute of supplemental oxygen at his baseline and was using that when symptoms develop. There was no associated chest pain, palpitations, nausea, or diaphoresis. There's been no hemoptysis. Patient denies sick contacts. He was placed on nonrebreather en route to the hospital.  Hospital Course:  Acute on chronic hypoxic respiratory failure/ Acute COPD exacerbation  -Patient requires 6 Lpm of supplemental O2 at baseline for a chronic lung disease for which he is seeing a pulmonologist in [place]; patient last saw his pulmonologist in September and workup is in progress -Upon admission, patient was noted to have wheezing and diminished breath sounds- admitted for COPD exac. -CTA chest: No evidence of acute pulmonary embolism, central enlargement pulmonary arteries suspicious for pulmonary arterial hypertension. Evidence of underlying chronic lung disease, ? Pneumonia -On room air, O2 sats in the 60s -While on 6L, O2 sats in the 90s -Patient placed on nebs, Solu-Medrol, and Levaquin -Will discharge patient with prednisone taper as well as 4 days of Levaquin -Blood cultures pending. No sputum culture was given.  ?Acute on chronic right-heart failure -Evidence for right-heart failure on CTA chest, likely secondary to the chronic lung disease   There is no significant peripheral edema on admission  -Given Lasix 20 mg IV in ED  -No echocardiogram in our system. -Patient does appear to be euvolemic at this time.  Normocytic anemia   -Hgb 9.7 on admission with no priors available for comparison  -No signs or symptoms of active  blood loss -Follow-up with PCP in one week for repeat CBC  Hypokalemia -Resolved, potassium on admission was 3.1  -Currently potassium 4.8  -Magnesium 1.6 -Give magnesium IV  supplementation -Repeat BMP and magnesium in one week  ST-depressions on EKG  -No angina on arrival and pt denies history of heart disease  -No prior EKG for comparison -Troponins cycled 0.03, <0.03 -ASA 324 mg given in ED  -EKG this morning shows T-wave depressions. Reviewed with cardiology, Dr. Meda Coffee.  -Spoke with patient regarding cardiology follow up here while in the hospital for possible stress testing. Patient insistent on going home today and will follow up with cardiology in [place].   Procedures: None  Consultations: Dr. Meda Coffee, cardiology, via phone  Discharge Exam: Vitals:   06/17/16 0050 06/17/16 0431  BP: 118/81 127/84  Pulse: 95 85  Resp: 19 18  Temp: 98.3 F (36.8 C) 97.6 F (36.4 C)     General: Well developed, well nourished, NAD, appears stated age  HEENT: NCAT, mucous membranes moist.  Cardiovascular: S1 S2 auscultated, no rubs, murmurs or gallops. Regular rate and rhythm.  Respiratory: Diminished but clear  Abdomen: Soft, nontender, nondistended, + bowel sounds  Extremities: warm dry without cyanosis clubbing or edema  Neuro: AAOx3, nonfocal  Psych: Normal affect and demeanor   Discharge Instructions Discharge Instructions    Discharge instructions    Complete by:  As directed    Patient will be discharged to home with home oxygen.  Patient will need to follow up with primary care provider within one week of discharge.  Follow up pulmonology and cardiology. Patient should continue medications as prescribed.  Patient should follow a heart healthy diet.     Current Discharge Medication List    CONTINUE these medications which have CHANGED   Details  predniSONE (DELTASONE) 10 MG tablet Take 40mg  (4 tabs) x 3 days, then taper to 30mg  (3 tabs) x 3 days, then 20mg  (2 tabs) x 3days, then 10mg  (1 tab) x 3days Qty: 30 tablet, Refills: 0      CONTINUE these medications which have NOT CHANGED   Details  albuterol (PROVENTIL HFA;VENTOLIN HFA)  108 (90 Base) MCG/ACT inhaler Inhale 1 puff into the lungs every 6 (six) hours as needed for wheezing or shortness of breath.    aspirin EC 81 MG tablet Take 81 mg by mouth daily.    benzonatate (TESSALON) 100 MG capsule Take 200 mg by mouth daily.    bicalutamide (CASODEX) 50 MG tablet Take 50 mg by mouth daily. Refills: 0    chlorpheniramine-HYDROcodone (TUSSIONEX PENNKINETIC ER) 10-8 MG/5ML SUER Take 5 mLs by mouth 2 (two) times daily.    Cholecalciferol (VITAMIN D) 2000 units tablet Take 2,000 Units by mouth daily.    oxyCODONE-acetaminophen (PERCOCET) 10-325 MG tablet Take 1 tablet by mouth every 8 (eight) hours.    OXYGEN Inhale 6 L into the lungs continuous.    PRESCRIPTION MEDICATION Cancer treatment injection every 3 months at Clinic in [place], [place] - next injection due approx 07/21/16       No Known Allergies Follow-up Information    Primary care physician. Schedule an appointment as soon as possible for a visit in 1 week(s).   Why:  Hospital follow up           The results of significant diagnostics from this hospitalization (including imaging, microbiology, ancillary and laboratory) are listed below for reference.    Significant Diagnostic Studies: Ct Angio Chest Pe W And/or Wo Contrast  Result Date: 06/16/2016 CLINICAL DATA:  Progressive shortness breath for 1 year. Former smoker. Tachycardia without chest pain. EXAM: CT ANGIOGRAPHY CHEST WITH CONTRAST TECHNIQUE: Multidetector CT imaging of the chest was performed using the standard protocol during bolus administration of intravenous contrast. Multiplanar CT image reconstructions and MIPs were obtained to evaluate the vascular anatomy. CONTRAST:  80 ml Isovue 370 COMPARISON:  None. FINDINGS: Cardiovascular: The pulmonary arteries are well opacified to the level of the subsegmental branches. No evidence of acute pulmonary embolism. There is central enlargement of the pulmonary arteries suspicious for pulmonary  arterial hypertension. There is suboptimal opacification of the aortic lumen. There is diffuse atherosclerosis of the aorta, great vessels and coronary arteries. No acute vascular findings are seen. The heart is enlarged. There is no pericardial effusion. Mediastinum/Nodes: There are no enlarged mediastinal, hilar or axillary lymph nodes.There are small mediastinal and hilar lymph nodes. The thyroid gland, trachea and esophagus demonstrate no significant findings. Lungs/Pleura: There is no pleural effusion. There is evidence of severe underlying chronic lung disease with patchy multifocal ground-glass attenuation, subpleural and peribronchovascular reticulation and architectural distortion. Probable early honeycomb formation at both lung bases. Upper abdomen: There is some reflux of contrast into the IVC and hepatic veins. The visualized upper abdomen otherwise appears unremarkable. Musculoskeletal/Chest wall: There is no chest wall mass or suspicious osseous finding. Mild degenerative changes throughout the spine status post lower cervical fusion. Review of the MIP images confirms the above findings. IMPRESSION: 1. No evidence of acute pulmonary embolism. Central enlargement of the pulmonary arteries suspicious for pulmonary arterial hypertension. 2. Evidence of underlying chronic lung disease, possibly desquamative interstitial pneumonia. Without prior examinations, mild superimposed edema or atypical infection difficult to exclude. Chest radiographic follow up recommended. 3. Cardiomegaly with reflux of contrast into the IVC and hepatic veins consistent with right heart failure. 4. No evidence of thoracic malignancy. Electronically Signed   By: Richardean Sale M.D.   On: 06/16/2016 17:34    Microbiology: No results found for this or any previous visit (from the past 240 hour(s)).   Labs: Basic Metabolic Panel:  Recent Labs Lab 06/16/16 1550 06/16/16 2212 06/17/16 0815  NA 136  --  136  K 3.1*  --   4.8  CL 102  --  101  CO2 26  --  28  GLUCOSE 138*  --  130*  BUN 14  --  15  CREATININE 0.88  --  0.93  CALCIUM 8.5*  --  8.8*  MG  --  1.6*  --    Liver Function Tests:  Recent Labs Lab 06/16/16 1550  AST 16  ALT 7*  ALKPHOS 47  BILITOT 0.5  PROT 6.8  ALBUMIN 2.8*   No results for input(s): LIPASE, AMYLASE in the last 168 hours. No results for input(s): AMMONIA in the last 168 hours. CBC:  Recent Labs Lab 06/16/16 1550  WBC 7.3  NEUTROABS 4.1  HGB 9.7*  HCT 31.7*  MCV 93.2  PLT 233   Cardiac Enzymes:  Recent Labs Lab 06/16/16 2212 06/17/16 0140 06/17/16 0815  TROPONINI 0.03* 0.03* <0.03   BNP: BNP (last 3 results)  Recent Labs  06/16/16 1550 06/16/16 2212  BNP 156.5* 239.2*    ProBNP (last 3 results) No results for input(s): PROBNP in the last 8760 hours.  CBG:  Recent Labs Lab 06/17/16 0627 06/17/16 1140  GLUCAP 125* 215*       Signed:  Cristal Ford  [facility] 06/17/2016, 12:41 PM

## 2016-06-17 NOTE — Progress Notes (Signed)
Patient states he has been on 02 n/c for 5 months, at baseline 6l.

## 2016-06-17 NOTE — Discharge Instructions (Signed)
Chronic Obstructive Pulmonary Disease Chronic obstructive pulmonary disease (COPD) is a common lung condition in which airflow from the lungs is limited. COPD is a general term that can be used to describe many different lung problems that limit airflow, including both chronic bronchitis and emphysema. If you have COPD, your lung function will probably never return to normal, but there are measures you can take to improve lung function and make yourself feel better. CAUSES   Smoking (common).  Exposure to secondhand smoke.  Genetic problems.  Chronic inflammatory lung diseases or recurrent infections. SYMPTOMS  Shortness of breath, especially with physical activity.  Deep, persistent (chronic) cough with a large amount of thick mucus.  Wheezing.  Rapid breaths (tachypnea).  Gray or bluish discoloration (cyanosis) of the skin, especially in your fingers, toes, or lips.  Fatigue.  Weight loss.  Frequent infections or episodes when breathing symptoms become much worse (exacerbations).  Chest tightness. DIAGNOSIS Your health care provider will take a medical history and perform a physical examination to diagnose COPD. Additional tests for COPD may include:  Lung (pulmonary) function tests.  Chest X-ray.  CT scan.  Blood tests. TREATMENT  Treatment for COPD may include:  Inhaler and nebulizer medicines. These help manage the symptoms of COPD and make your breathing more comfortable.  Supplemental oxygen. Supplemental oxygen is only helpful if you have a low oxygen level in your blood.  Exercise and physical activity. These are beneficial for nearly all people with COPD.  Lung surgery or transplant.  Nutrition therapy to gain weight, if you are underweight.  Pulmonary rehabilitation. This may involve working with a team of health care providers and specialists, such as respiratory, occupational, and physical therapists. HOME CARE INSTRUCTIONS  Take all medicines  (inhaled or pills) as directed by your health care provider.  Avoid over-the-counter medicines or cough syrups that dry up your airway (such as antihistamines) and slow down the elimination of secretions unless instructed otherwise by your health care provider.  If you are a smoker, the most important thing that you can do is stop smoking. Continuing to smoke will cause further lung damage and breathing trouble. Ask your health care provider for help with quitting smoking. He or she can direct you to community resources or hospitals that provide support.  Avoid exposure to irritants such as smoke, chemicals, and fumes that aggravate your breathing.  Use oxygen therapy and pulmonary rehabilitation if directed by your health care provider. If you require home oxygen therapy, ask your health care provider whether you should purchase a pulse oximeter to measure your oxygen level at home.  Avoid contact with individuals who have a contagious illness.  Avoid extreme temperature and humidity changes.  Eat healthy foods. Eating smaller, more frequent meals and resting before meals may help you maintain your strength.  Stay active, but balance activity with periods of rest. Exercise and physical activity will help you maintain your ability to do things you want to do.  Preventing infection and hospitalization is very important when you have COPD. Make sure to receive all the vaccines your health care provider recommends, especially the pneumococcal and influenza vaccines. Ask your health care provider whether you need a pneumonia vaccine.  Learn and use relaxation techniques to manage stress.  Learn and use controlled breathing techniques as directed by your health care provider. Controlled breathing techniques include:  Pursed lip breathing. Start by breathing in (inhaling) through your nose for 1 second. Then, purse your lips as if you were   going to whistle and breathe out (exhale) through the  pursed lips for 2 seconds.  Diaphragmatic breathing. Start by putting one hand on your abdomen just above your waist. Inhale slowly through your nose. The hand on your abdomen should move out. Then purse your lips and exhale slowly. You should be able to feel the hand on your abdomen moving in as you exhale.  Learn and use controlled coughing to clear mucus from your lungs. Controlled coughing is a series of short, progressive coughs. The steps of controlled coughing are: 1. Lean your head slightly forward. 2. Breathe in deeply using diaphragmatic breathing. 3. Try to hold your breath for 3 seconds. 4. Keep your mouth slightly open while coughing twice. 5. Spit any mucus out into a tissue. 6. Rest and repeat the steps once or twice as needed. SEEK MEDICAL CARE IF:  You are coughing up more mucus than usual.  There is a change in the color or thickness of your mucus.  Your breathing is more labored than usual.  Your breathing is faster than usual. SEEK IMMEDIATE MEDICAL CARE IF:  You have shortness of breath while you are resting.  You have shortness of breath that prevents you from:  Being able to talk.  Performing your usual physical activities.  You have chest pain lasting longer than 5 minutes.  Your skin color is more cyanotic than usual.  You measure low oxygen saturations for longer than 5 minutes with a pulse oximeter. MAKE SURE YOU:  Understand these instructions.  Will watch your condition.  Will get help right away if you are not doing well or get worse.   This information is not intended to replace advice given to you by your health care provider. Make sure you discuss any questions you have with your health care provider.   Document Released: 06/07/2005 Document Revised: 09/18/2014 Document Reviewed: 04/24/2013 [employer] Interactive Patient Education 2016 [employer]  

## 2016-06-18 LAB — URINE CULTURE: Culture: NO GROWTH

## 2016-06-21 LAB — CULTURE, BLOOD (ROUTINE X 2)
CULTURE: NO GROWTH
Culture: NO GROWTH

## 2016-10-12 DEATH — deceased

## 2017-12-26 IMAGING — CT CT ANGIO CHEST
2 of 8 series · 18 of 46 positions shown · IV contrast (OMNI)
Comparison: None.

CLINICAL DATA: Progressive shortness breath for 1 year. Former
smoker. Tachycardia without chest pain.

EXAM:
CT ANGIOGRAPHY CHEST WITH CONTRAST
TECHNIQUE: Multidetector CT imaging of the chest was performed using the
standard protocol during bolus administration of intravenous
contrast. Multiplanar CT image reconstructions and MIPs were
obtained to evaluate the vascular anatomy.
CONTRAST:  80 ml Isovue 370

[Series 5: thins · axial · 0.60mm/px · z∈[-485,-221]mm · 15 of 292 slices shown]
[im 14/292  lung]
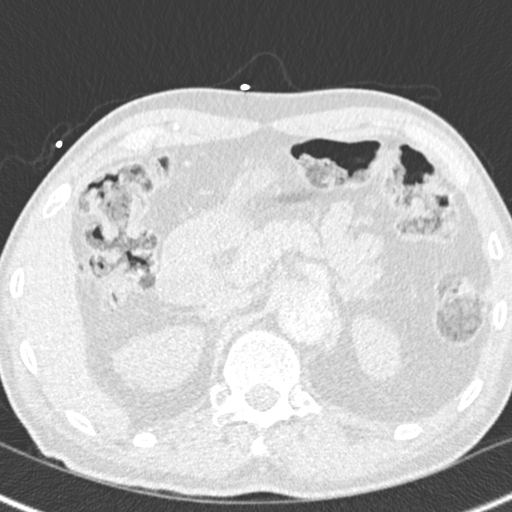
[im 40/292  soft-tissue]
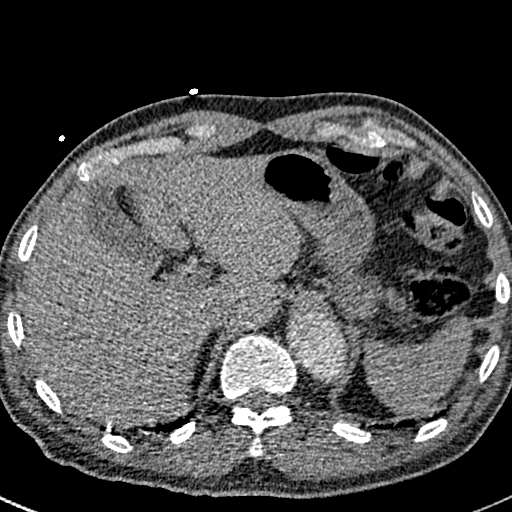
[im 53/292  lung]
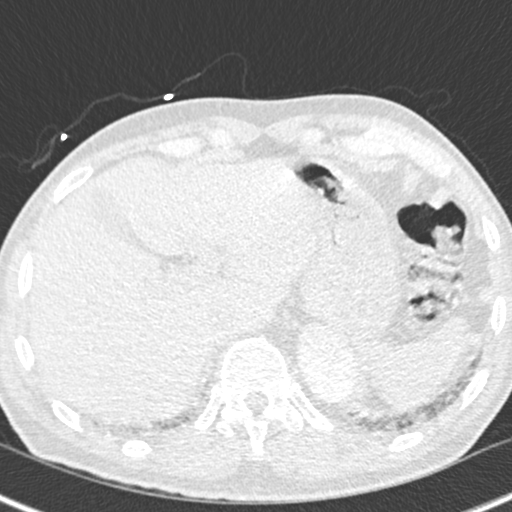
[im 67/292  soft-tissue]
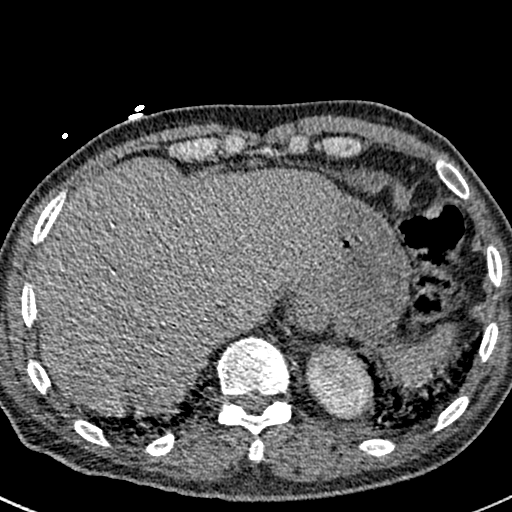
[im 93/292  lung]
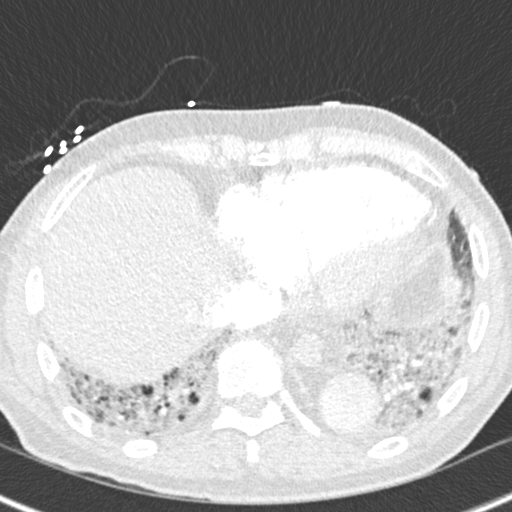
[im 106/292  soft-tissue]
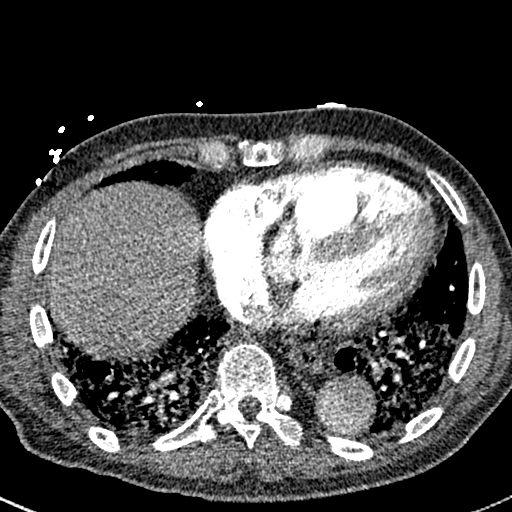
[im 133/292  lung]
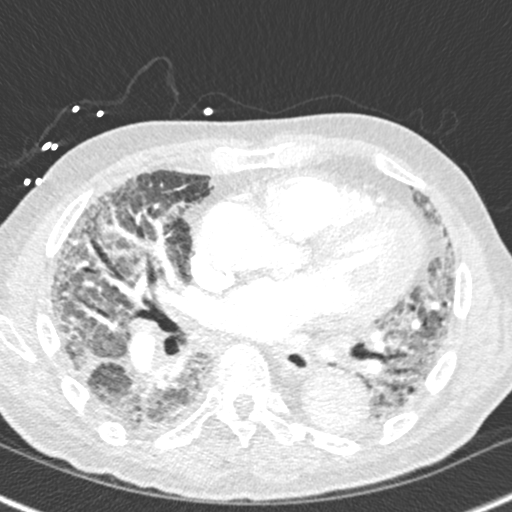
[im 146/292  soft-tissue]
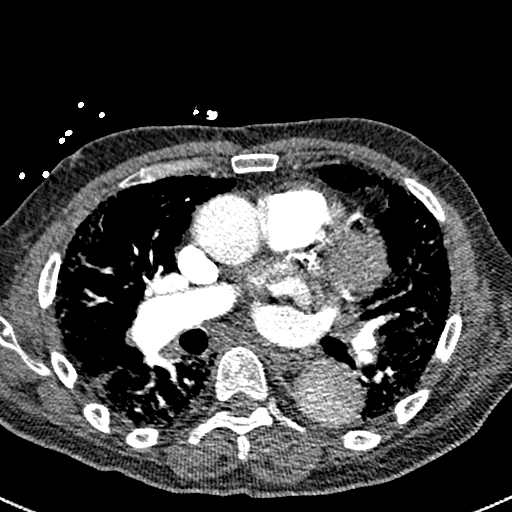
[im 159/292  lung]
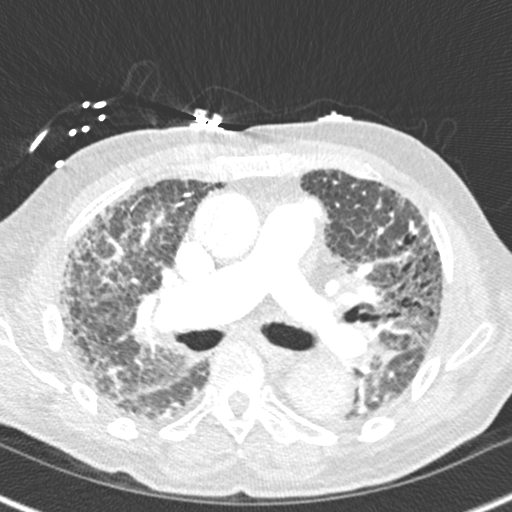
[im 186/292  soft-tissue]
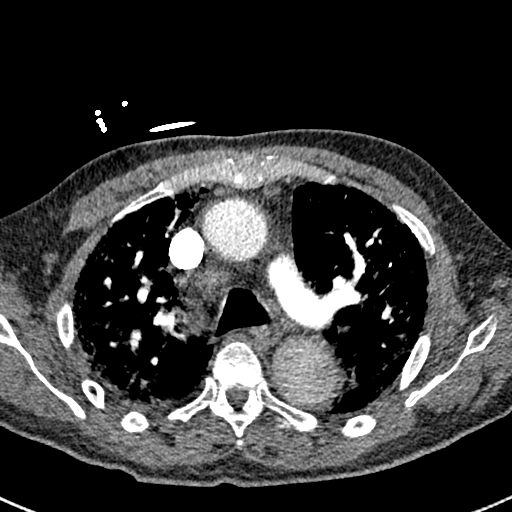
[im 199/292  lung]
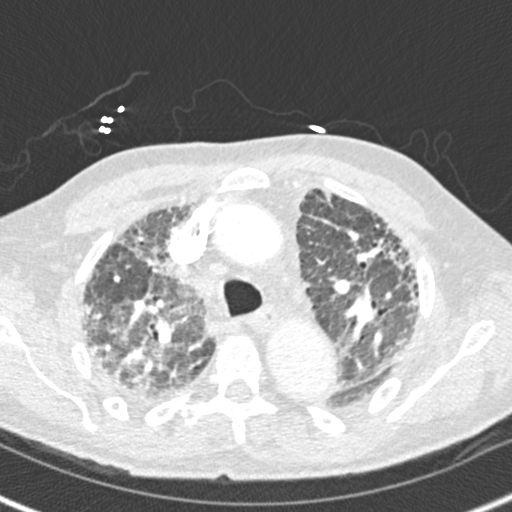
[im 225/292  soft-tissue]
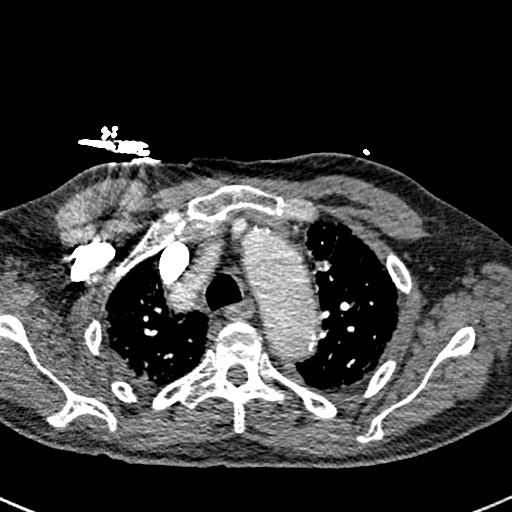
[im 239/292  lung]
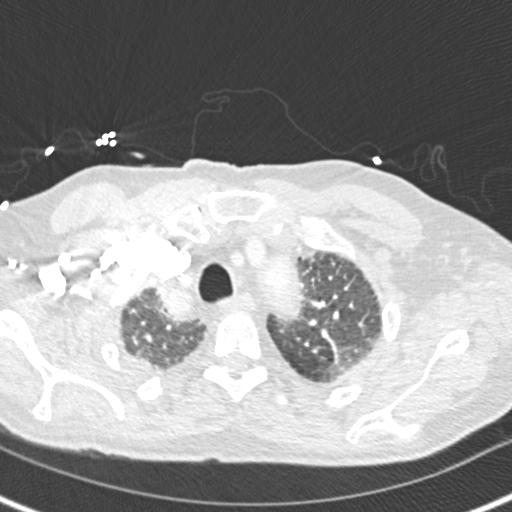
[im 252/292  soft-tissue]
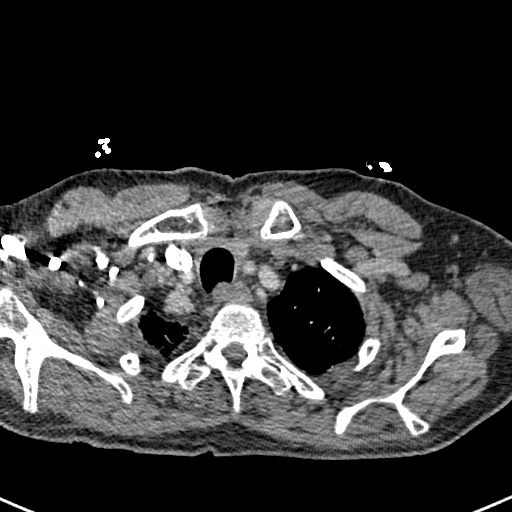
[im 278/292  lung]
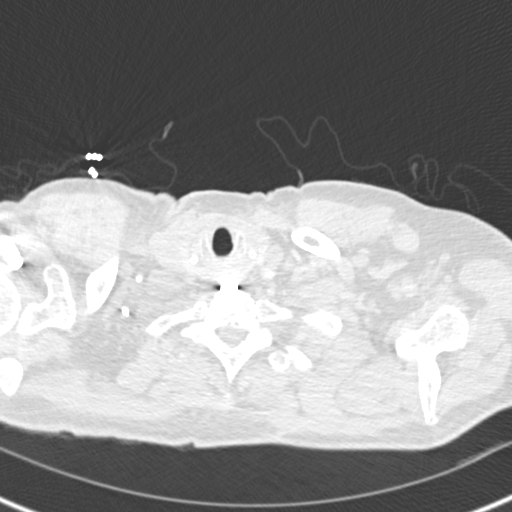

[Series 7: coronal mpr · coronal · 0.59mm/px · 3 of 114 slices shown]
[im 29/114  soft-tissue]
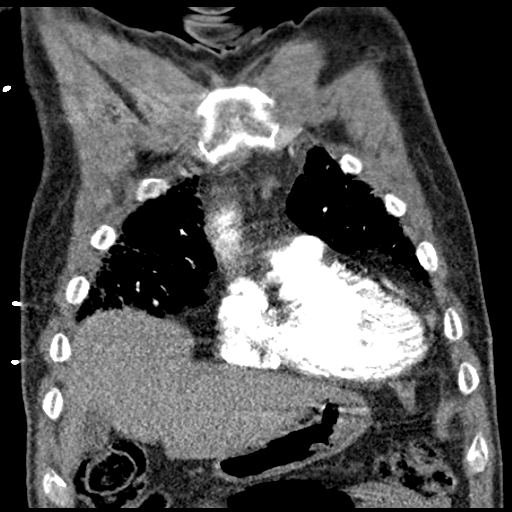
[im 57/114  soft-tissue]
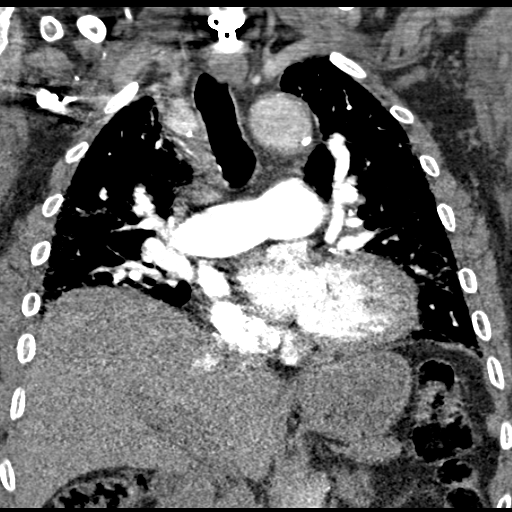
[im 85/114  soft-tissue]
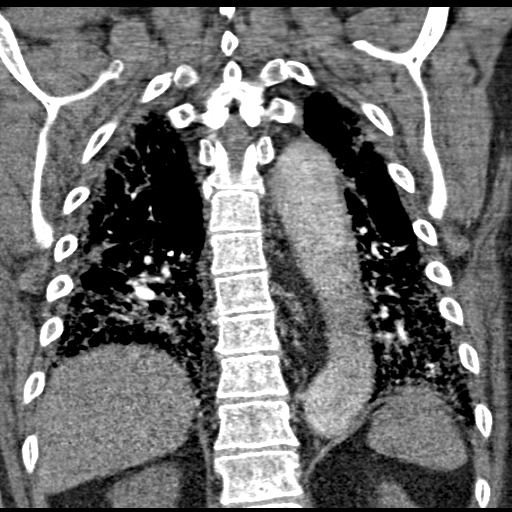

[18 of 46 positions shown; findings below may reference images not displayed]

FINDINGS: Cardiovascular: The pulmonary arteries are well opacified to the
level of the subsegmental branches. No evidence of acute pulmonary
embolism. There is central enlargement of the pulmonary arteries
suspicious for pulmonary arterial hypertension. There is suboptimal
opacification of the aortic lumen. There is diffuse atherosclerosis
of the aorta, great vessels and coronary arteries. No acute vascular
findings are seen. The heart is enlarged. There is no pericardial
effusion.

Mediastinum/Nodes: There are no enlarged mediastinal, hilar or
axillary lymph nodes.There are small mediastinal and hilar lymph
nodes. The thyroid gland, trachea and esophagus demonstrate no
significant findings.

Lungs/Pleura: There is no pleural effusion. There is evidence of
severe underlying chronic lung disease with patchy multifocal
ground-glass attenuation, subpleural and peribronchovascular
reticulation and architectural distortion. Probable early honeycomb
formation at both lung bases.

Upper abdomen: There is some reflux of contrast into the IVC and
hepatic veins. The visualized upper abdomen otherwise appears
unremarkable.

Musculoskeletal/Chest wall: There is no chest wall mass or
suspicious osseous finding. Mild degenerative changes throughout the
spine status post lower cervical fusion.

Review of the MIP images confirms the above findings.
IMPRESSION: 1. No evidence of acute pulmonary embolism. Central enlargement of
the pulmonary arteries suspicious for pulmonary arterial
hypertension.
2. Evidence of underlying chronic lung disease, possibly
desquamative interstitial pneumonia. Without prior examinations,
mild superimposed edema or atypical infection difficult to exclude.
Chest radiographic follow up recommended.
3. Cardiomegaly with reflux of contrast into the IVC and hepatic
veins consistent with right heart failure.
4. No evidence of thoracic malignancy.
